# Patient Record
Sex: Female | Born: 1985 | Race: White | Hispanic: No | Marital: Single | Smoking: Former smoker
Health system: Southern US, Community
[De-identification: ages and names within clinical notes are randomized; demographics above are authoritative.]

## PROBLEM LIST (undated history)

## (undated) ENCOUNTER — Inpatient Hospital Stay: Payer: Self-pay

## (undated) DIAGNOSIS — I10 Essential (primary) hypertension: Secondary | ICD-10-CM

## (undated) HISTORY — PX: DILATION AND CURETTAGE OF UTERUS: SHX78

---
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
## 2008-11-29 ENCOUNTER — Emergency Department: Payer: Self-pay | Admitting: Emergency Medicine

## 2008-12-12 ENCOUNTER — Emergency Department: Payer: Self-pay | Admitting: Emergency Medicine

## 2009-07-06 ENCOUNTER — Emergency Department: Payer: Self-pay | Admitting: Unknown Physician Specialty

## 2009-07-26 ENCOUNTER — Ambulatory Visit: Payer: Self-pay

## 2010-07-12 ENCOUNTER — Observation Stay: Payer: Self-pay

## 2010-08-01 ENCOUNTER — Observation Stay: Payer: Self-pay | Admitting: Obstetrics and Gynecology

## 2010-08-04 ENCOUNTER — Inpatient Hospital Stay: Payer: Self-pay

## 2010-08-09 ENCOUNTER — Inpatient Hospital Stay: Payer: Self-pay | Admitting: Obstetrics and Gynecology

## 2011-01-08 ENCOUNTER — Ambulatory Visit: Payer: Self-pay

## 2011-07-23 IMAGING — US US RENAL KIDNEY
1 series · 17 of 25 positions shown · non-contrast
Comparison: none

REASON FOR EXAM: right urolitiasis, 37 weeks EGA
COMMENTS:   LMP: N/A

[Series 1: us renal kidney · 17 of 60 slices shown]
[im 1/60]
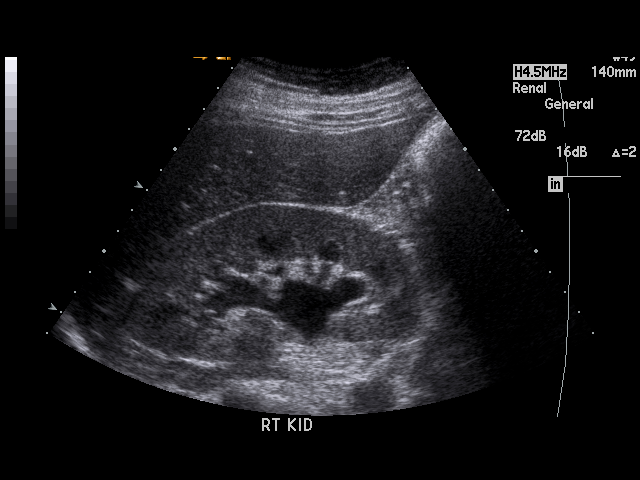
[im 5/60]
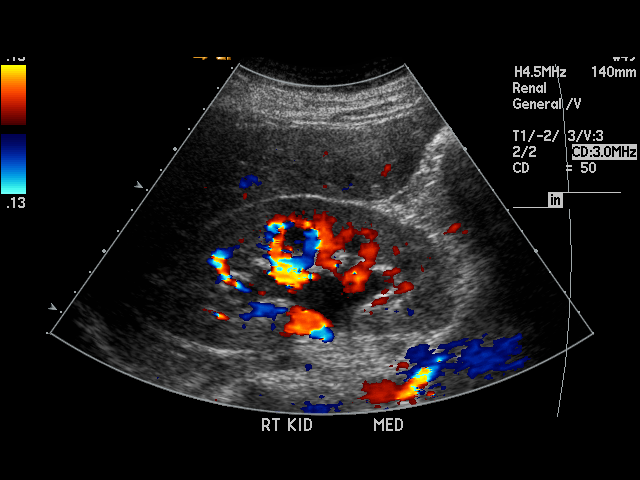
[im 8/60]
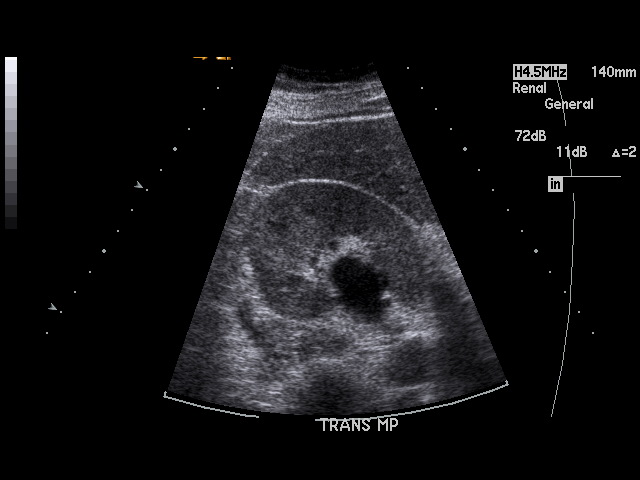
[im 13/60]
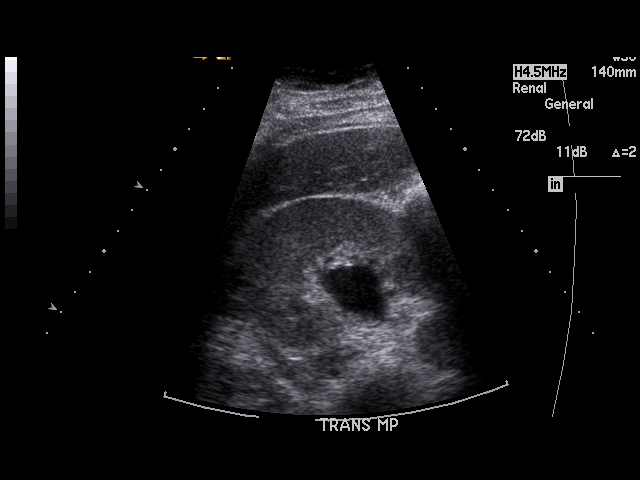
[im 15/60]
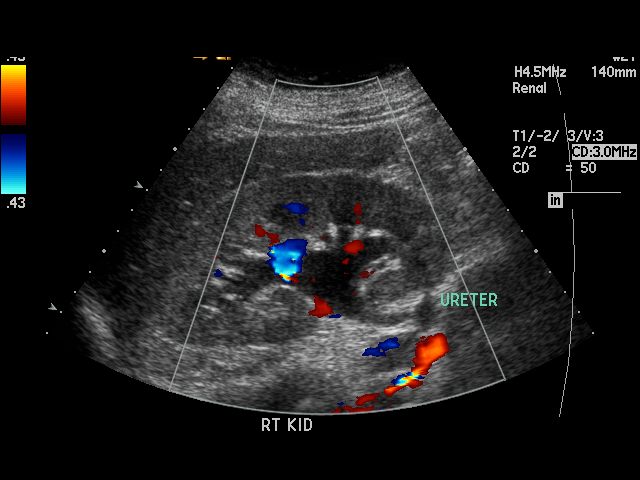
[im 20/60]
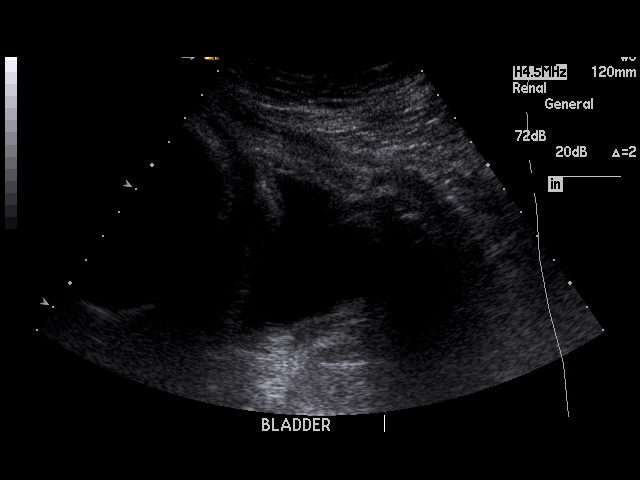
[im 23/60]
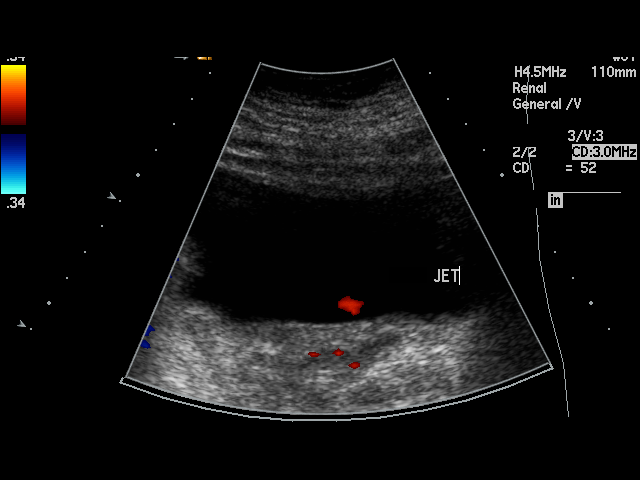
[im 28/60]
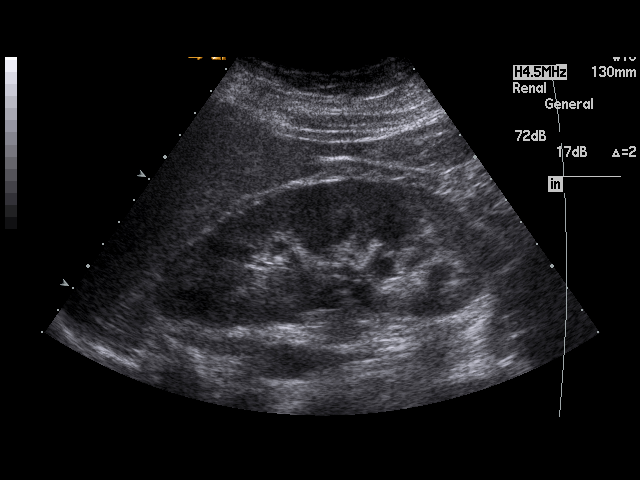
[im 30/60]
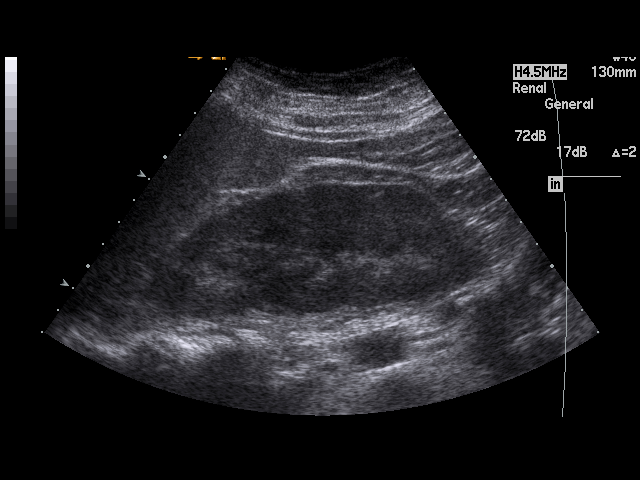
[im 32/60]
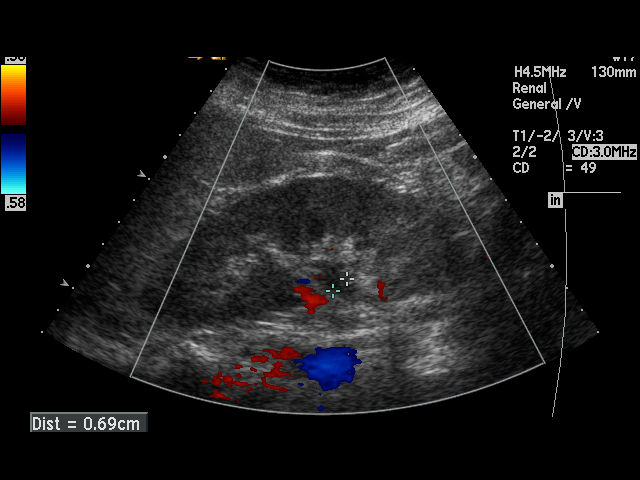
[im 37/60]
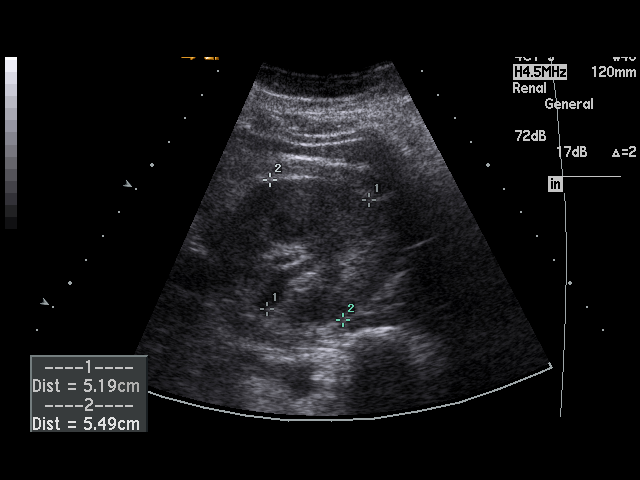
[im 40/60]
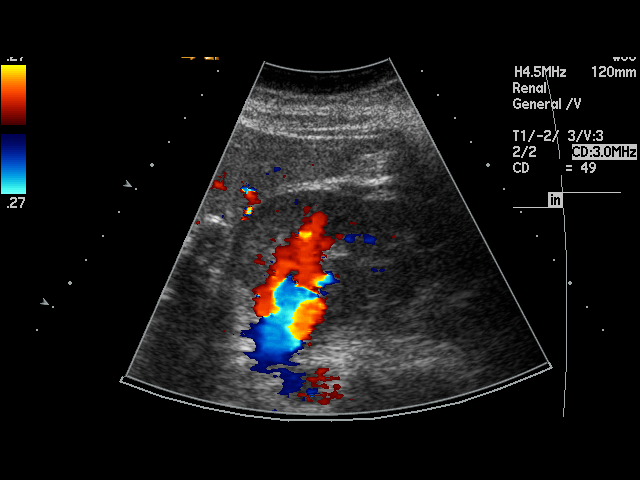
[im 45/60]
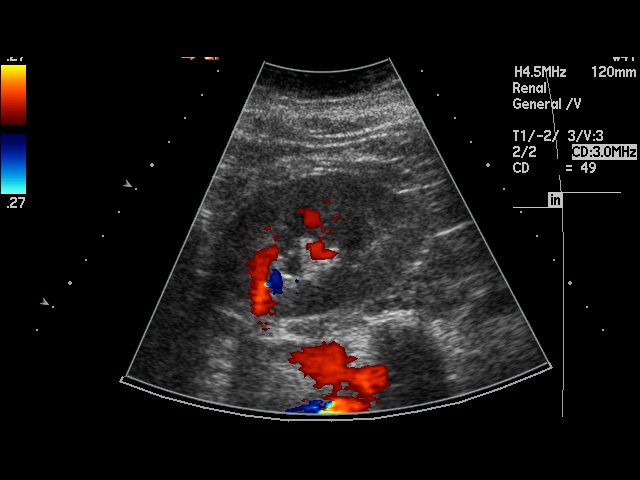
[im 47/60]
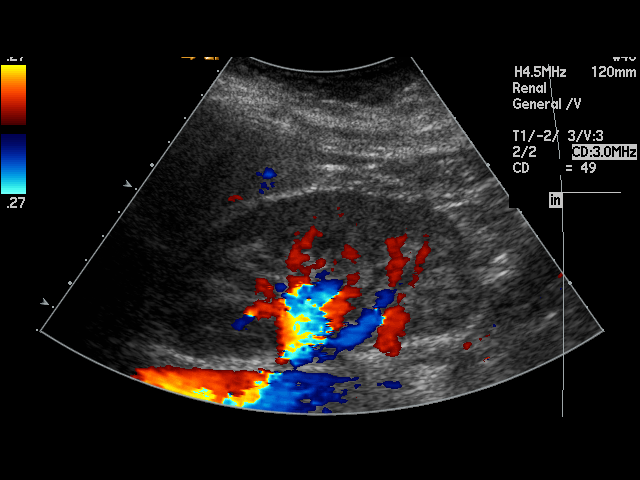
[im 52/60]
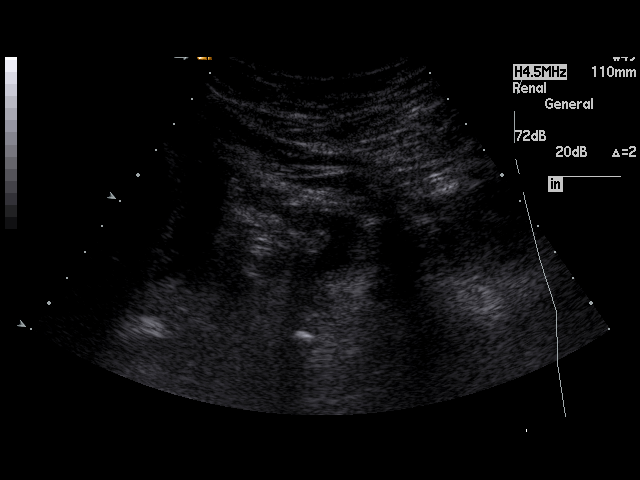
[im 55/60]
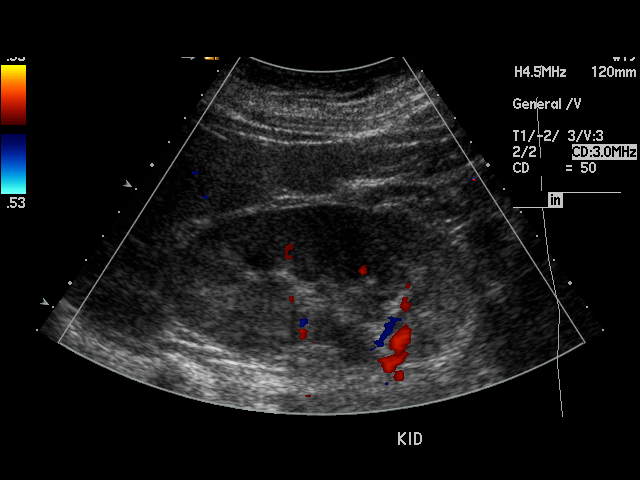
[im 60/60]
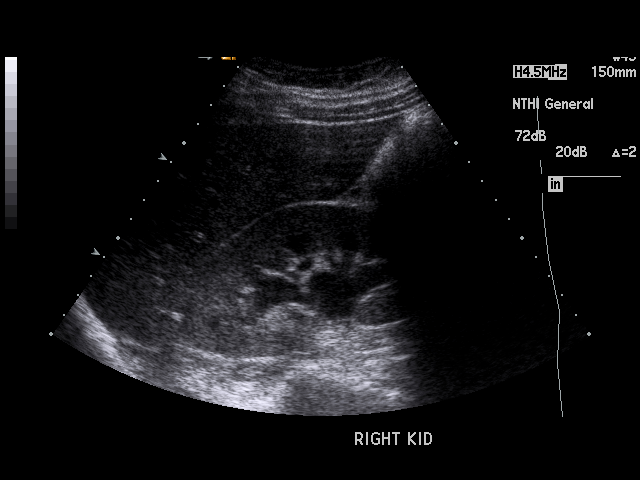

[17 of 25 positions shown; findings below may reference images not displayed]

PROCEDURE:     US  - US KIDNEY  - August 05, 2010 [DATE]

RESULT:     The right kidney measures 12.51 x 6.4 x 6.68 cm and the left
12.77 x 5.19 x 5.48 cm.

The right kidney demonstrates appropriate corticomedullary differentiation.
There is moderate hydronephrosis within the right kidney without evidence of
masses or sonographic evidence of calculus disease on the right. The
hydronephrosis involving the right kidney become slightly less prominent
post void.

The left kidney demonstrates mild caliectasis which improves post void.
Otherwise, there is no evidence of hydronephrosis. There is appropriate
corticomedullary differentiation. A calculus is identified within the lower
pole of the left kidney which appears be a nonobstructing calculus.
Evaluation of the urinary bladder demonstrates an appreciable ureteral jet
on the left with no appreciable jet on the right. There is a trace post void
residual. The bladder is otherwise unremarkable.
IMPRESSION: 1. Moderate hydronephrosis involving the right kidney, a component of which
is likely secondary to hydronephrosis of gestation.
2. Mild pelviectasis involving the left kidney again a component which is
likely secondary to the patient's pregnancy.
3. Nonobstructing calculus on the left.
4. There was no sonographic evidence of calculus disease on the right though
a distal partially obstructing calculus cannot be completely excluded.
Surveillance evaluation recommended if and as clinically warranted.

## 2011-08-23 ENCOUNTER — Emergency Department: Payer: Self-pay | Admitting: Emergency Medicine

## 2011-12-05 ENCOUNTER — Emergency Department: Payer: Self-pay | Admitting: Internal Medicine

## 2012-03-30 ENCOUNTER — Emergency Department: Payer: Self-pay | Admitting: Emergency Medicine

## 2012-03-30 LAB — COMPREHENSIVE METABOLIC PANEL
Albumin: 3.8 g/dL (ref 3.4–5.0)
Alkaline Phosphatase: 96 U/L (ref 50–136)
Anion Gap: 6 — ABNORMAL LOW (ref 7–16)
BUN: 14 mg/dL (ref 7–18)
Bilirubin,Total: 0.2 mg/dL (ref 0.2–1.0)
Creatinine: 0.82 mg/dL (ref 0.60–1.30)
EGFR (African American): >60
Glucose: 86 mg/dL (ref 65–99)
Osmolality: 272 (ref 275–301)
SGPT (ALT): 46 U/L (ref 12–78)
Total Protein: 8.5 g/dL — ABNORMAL HIGH (ref 6.4–8.2)

## 2012-03-30 LAB — CBC
MCH: 29.7 pg (ref 26.0–34.0)
MCHC: 34.5 g/dL (ref 32.0–36.0)
MCV: 86 fL (ref 80–100)
Platelet: 253 10*3/uL (ref 150–440)
RBC: 4.64 10*6/uL (ref 3.80–5.20)
RDW: 12.7 % (ref 11.5–14.5)
WBC: 9.7 10*3/uL (ref 3.6–11.0)

## 2012-03-30 LAB — URINALYSIS, COMPLETE
Bacteria: NONE SEEN
Bilirubin,UR: NEGATIVE
Ph: 6 (ref 4.5–8.0)
Protein: NEGATIVE
RBC,UR: 26 /HPF (ref 0–5)
Squamous Epithelial: <1
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
## 2014-08-20 ENCOUNTER — Emergency Department
Admission: EM | Admit: 2014-08-20 | Discharge: 2014-08-20 | Disposition: A | Discharge status: Home / Self Care | Payer: PRIVATE HEALTH INSURANCE | Attending: Emergency Medicine | Admitting: Emergency Medicine

## 2014-08-20 DIAGNOSIS — G8929 Other chronic pain: Secondary | ICD-10-CM | POA: Diagnosis not present

## 2014-08-20 DIAGNOSIS — Z880 Allergy status to penicillin: Secondary | ICD-10-CM | POA: Diagnosis not present

## 2014-08-20 DIAGNOSIS — G589 Mononeuropathy, unspecified: Secondary | ICD-10-CM | POA: Insufficient documentation

## 2014-08-20 DIAGNOSIS — M5432 Sciatica, left side: Secondary | ICD-10-CM | POA: Insufficient documentation

## 2014-08-20 DIAGNOSIS — G5702 Lesion of sciatic nerve, left lower limb: Secondary | ICD-10-CM

## 2014-08-20 DIAGNOSIS — M549 Dorsalgia, unspecified: Secondary | ICD-10-CM | POA: Diagnosis present

## 2014-08-20 MED ORDER — HYDROMORPHONE HCL 1 MG/ML IJ SOLN
INTRAMUSCULAR | Status: AC
  Administered 2014-08-20: 1 mg via INTRAMUSCULAR
  Filled 2014-08-20: qty 1

## 2014-08-20 MED ORDER — HYDROMORPHONE HCL 1 MG/ML IJ SOLN
1.0000 mg | Freq: Once | INTRAMUSCULAR | Status: AC
  Administered 2014-08-20: 1 mg via INTRAMUSCULAR

## 2014-08-20 MED ORDER — DEXAMETHASONE SODIUM PHOSPHATE 10 MG/ML IJ SOLN
INTRAMUSCULAR | Status: AC
  Administered 2014-08-20: 10 mg via INTRAMUSCULAR
  Filled 2014-08-20: qty 1

## 2014-08-20 MED ORDER — DEXAMETHASONE SODIUM PHOSPHATE 10 MG/ML IJ SOLN
10.0000 mg | Freq: Once | INTRAMUSCULAR | Status: AC
  Administered 2014-08-20: 10 mg via INTRAMUSCULAR

## 2014-08-20 MED ORDER — METHYLPREDNISOLONE 4 MG PO TBPK: ORAL_TABLET | ORAL | Status: DC

## 2014-08-20 NOTE — ED Provider Notes (Signed)
[facility] Emergency Department Provider Note  ____________________________________________  Time seen: Approximately 4:38 PM  I have reviewed the triage vital signs and the nursing notes.   HISTORY  Chief Complaint Back Pain    HPI Rhonda Perez is a 29 y.o. female patient to the ER today with complaint of left sciatica. Patient has a 1 year plus of chronic back pain and radicular component to left lower extremity. Recently patient was seen by her Dr. and at the ER in [place]. Patient state the pain medication and anti-inflammatory is not helping. Numbness is increasing in the past 3 days. He denies any bladder or bowel dysfunction. Patient is rating her pain as a 10 over 10. Patient states she is unable to ambulate for any distance secondary to numbness of the leg.   No past medical history on file.  There are no active problems to display for this patient.   No past surgical history on file.  No current outpatient prescriptions on file.  Allergies Penicillins  No family history on file.  Social History History  Substance Use Topics  . Smoking status: Not on file  . Smokeless tobacco: Not on file  . Alcohol Use: Not on file    Review of Systems Constitutional: No fever/chills Eyes: No visual changes. ENT: No sore throat. Cardiovascular: Denies chest pain. Respiratory: Denies shortness of breath. Gastrointestinal: No abdominal pain.  No nausea, no vomiting.  No diarrhea.  No constipation. Genitourinary: Negative for dysuria. Musculoskeletal: Radicular left back pain. Skin: Negative for rash. Neurological: Negative for headaches, focal  Numbness to the left lower extremity.. : Allergic/Immunilogical: See medication list. 10-point ROS otherwise negative.  ____________________________________________   PHYSICAL EXAM:  VITAL SIGNS: ED Triage Vitals  Enc Vitals Group     BP 08/20/14 1524 129/87 mmHg     Pulse Rate 08/20/14  1524 95     Resp 08/20/14 1524 18     Temp 08/20/14 1524 98.8 F (37.1 C)     Temp Source 08/20/14 1524 Oral     SpO2 08/20/14 1524 99 %     Weight 08/20/14 1524 214 lb (97.07 kg)     Height 08/20/14 1524 4\' 11"  (1.499 m)     Head Cir --      Peak Flow --      Pain Score 08/20/14 1521 10     Pain Loc --      Pain Edu? --      Excl. in GC? --     Constitutional: Alert and oriented. Well appearing and in no acute distress. Overweight height with truncal obesity. Eyes: Conjunctivae are normal. PERRL. EOMI. Head: Atraumatic. Nose: No congestion/rhinnorhea. Mouth/Throat: Mucous membranes are moist.  Oropharynx non-erythematous. Neck: No stridor. No deformity for nuchal range of motion nontender palpation. Hematological/Lymphatic/Immunilogical: No cervical lymphadenopathy. Cardiovascular: Normal rate, regular rhythm. Grossly normal heart sounds.  Good peripheral circulation. Respiratory: Normal respiratory effort.  No retractions. Lungs CTAB. Gastrointestinal: Soft and nontender. No distention. No abdominal bruits. No CVA tenderness. Musculoskeletal: No spinal deformity. Moderate guarding palpation L3-S1. Neurologic:  Normal speech and language. No gross focal neurologic deficits are appreciated. Speech is normal. No gait instability. Skin:  Skin is warm, dry and intact. No rash noted. Psychiatric: Mood and affect are normal. Speech and behavior are normal.  ____________________________________________   LABS (all labs ordered are listed, but only abnormal results are displayed)  Labs Reviewed - No data to display ____________________________________________  EKG   ____________________________________________  RADIOLOGY  ____________________________________________   PROCEDURES  Procedure(s) performed: None  Critical Care performed: No  ____________________________________________   INITIAL IMPRESSION / ASSESSMENT AND PLAN / ED COURSE  Pertinent labs & imaging  results that were available during my care of the patient were reviewed by me and considered in my medical decision making (see chart for details).  Radicular back pain. ____________________________________________   FINAL CLINICAL IMPRESSION(S) / ED DIAGNOSES  Final diagnoses:  Sciatic mononeuropathy, left      Joni Reining, PA-C 08/20/14 1703  Sharyn Creamer, MD 08/21/14 [phone]

## 2014-08-20 NOTE — Discharge Instructions (Signed)
Advise to see PCP for MRI consideration/consult.

## 2014-08-20 NOTE — ED Notes (Signed)
Pt states hx of sciatica, states that since last Saturday she has been having pain in her back and left leg, reports that she has been seen by her dr and in the er at [facility]. Pt states that despite the medications she isn't improving and states that her left has been feeling numb for the past 3 days

## 2016-03-28 ENCOUNTER — Inpatient Hospital Stay
Admission: EM | Admit: 2016-03-28 | Discharge: 2016-03-28 | Disposition: A | Discharge status: Home / Self Care | Payer: Medicaid Other | Attending: Obstetrics and Gynecology | Admitting: Obstetrics and Gynecology

## 2016-03-28 ENCOUNTER — Encounter: Payer: Self-pay | Admitting: *Deleted

## 2016-03-28 DIAGNOSIS — O471 False labor at or after 37 completed weeks of gestation: Secondary | ICD-10-CM | POA: Diagnosis not present

## 2016-03-28 DIAGNOSIS — Z3A41 41 weeks gestation of pregnancy: Secondary | ICD-10-CM | POA: Diagnosis not present

## 2016-03-28 NOTE — Final Progress Note (Signed)
Physician Final Progress Note  Patient ID: Rhonda Perez MRN: [id number] DOB/AGE: 1985/06/05 30 y.o.  Admit date: 03/28/2016 Admitting provider: Conard NovakStephen D Jackson, MD Discharge date: 03/28/2016   Admission Diagnoses:  1) intrauterine pregnancy at 9070w0d  2) regular uterine contractions  Discharge Diagnoses:  1) intrauterine pregnancy at 3470w0d  2) false labor  History of Present Illness: The patient is a [age] y.o. female [phone] at 370w0d who presents for contractions and back pain that have been intermittent throughout the day. She notes +FM, no LOF, and no vaginal bleeding.  [facility] and [facility]   Hospital Course: Admitted for observation.  Fetal tracing reactive, category 1, the entire time she was here.  Cervical exam was closed.  Her vital signs were normal.  She was discharged with follow up in 4 days and encouraged to secure a delivery plan, given her gestational age.  Allergies  Allergen Reactions  . Penicillins Hives    Social History   Social History  . Marital status: Single    Spouse name: N/A  . Number of children: N/A  . Years of education: N/A   Occupational History  . Not on file.   Social History Main Topics  . Smoking status: Former Games developermoker  . Smokeless tobacco: Former NeurosurgeonUser  . Alcohol use No  . Drug use: No  . Sexual activity: Yes   Other Topics Concern  . Not on file   Social History Narrative  . No narrative on file    Physical Exam: BP (!) 121/55 (BP Location: Right Arm)   Pulse 100   Temp 98.3 F (36.8 C) (Oral)   Resp 18   Gen: NAD CV: RRR Pulm: CTAB Pelvic: closed per RN Ext: no e/c/t  Consults: None  Significant Findings/ Diagnostic Studies: None  Procedures: NST Baseline: 150 Variability: moderate Accelerations: present Decelerations: absent Tocometry: infrequent   Discharge Condition: stable  Disposition: 01-Home or Self Care  Diet: Regular diet  Discharge  Activity: Activity as tolerated   Allergies as of 03/28/2016      Reactions   Penicillins Hives      Medication List    STOP taking these medications   methylPREDNISolone 4 MG Tbpk tablet Commonly known as:  MEDROL DOSEPAK        Total time spent taking care of this patient: 30 minutes  Signed: Conard NovakJackson, Stephen D, MD  03/28/2016, 6:33 PM

## 2016-03-28 NOTE — Discharge Summary (Signed)
See final progress note. 

## 2016-03-28 NOTE — Discharge Instructions (Signed)
Discharge instructions and labor precautions discussed with patient.  Patient verbalized understanding of all discharge instructions, copy signed and given.  Pt. Also encouraged to follow [facility]. Pt. verbalized scheduled [facility]. Pt. Encouraged to keep appt. for delivery.

## 2016-03-28 NOTE — OB Triage Note (Signed)
Pt. Here due to pain on right side that radiates to lower back, pain started this morning between 6-7 a.m. Pt. Denies sudden gush of fluid, or vaginal bleeding.  He's not moving as much as he normally does. Last sexual encounter was last night.

## 2017-01-12 ENCOUNTER — Encounter (HOSPITAL_COMMUNITY): Payer: Self-pay

## 2023-08-22 ENCOUNTER — Encounter: Payer: Self-pay | Admitting: Obstetrics and Gynecology

## 2023-08-22 ENCOUNTER — Other Ambulatory Visit: Payer: Self-pay

## 2023-08-22 ENCOUNTER — Observation Stay
Admission: EM | Admit: 2023-08-22 | Discharge: 2023-08-22 | Disposition: A | Discharge status: Home / Self Care | Attending: Obstetrics and Gynecology | Admitting: Obstetrics and Gynecology

## 2023-08-22 DIAGNOSIS — O99213 Obesity complicating pregnancy, third trimester: Secondary | ICD-10-CM | POA: Diagnosis not present

## 2023-08-22 DIAGNOSIS — Z7982 Long term (current) use of aspirin: Secondary | ICD-10-CM | POA: Insufficient documentation

## 2023-08-22 DIAGNOSIS — Z87891 Personal history of nicotine dependence: Secondary | ICD-10-CM | POA: Diagnosis not present

## 2023-08-22 DIAGNOSIS — O10912 Unspecified pre-existing hypertension complicating pregnancy, second trimester: Secondary | ICD-10-CM | POA: Insufficient documentation

## 2023-08-22 DIAGNOSIS — O368120 Decreased fetal movements, second trimester, not applicable or unspecified: Principal | ICD-10-CM | POA: Diagnosis not present

## 2023-08-22 DIAGNOSIS — Z79899 Other long term (current) drug therapy: Secondary | ICD-10-CM | POA: Diagnosis not present

## 2023-08-22 DIAGNOSIS — Z3A27 27 weeks gestation of pregnancy: Secondary | ICD-10-CM | POA: Insufficient documentation

## 2023-08-22 DIAGNOSIS — Z6841 Body Mass Index (BMI) 40.0 and over, adult: Secondary | ICD-10-CM | POA: Diagnosis not present

## 2023-08-22 DIAGNOSIS — E669 Obesity, unspecified: Secondary | ICD-10-CM | POA: Diagnosis not present

## 2023-08-22 HISTORY — DX: Essential (primary) hypertension: I10

## 2023-08-22 NOTE — Discharge Summary (Signed)
 Rhonda Perez is a 38 y.o. female. She is at [redacted]w[redacted]d gestation. No LMP recorded. Patient is pregnant. Estimated Date of Delivery: 11/19/23  Prenatal care site: [facility]   Chief complaint: Decreased Fetal Movement   HPI: Rhonda Perez presents to L&D with concerns of decreased fetal movement. She reports she has felt little movement since last night (06/13). Denies contractions, vaginal bleeding, and leakage of fluid. She reports active fetal movement today (06/14) upon US  and toco monitored being placed by RN.   Factors complicating pregnancy: Obesity- BMI: 43.83  S: Resting comfortably. Denies contractions, vaginal bleeding and leakage of fluid.   Maternal Medical History:  Past Medical Hx:  has a past medical history of Hypertension.    Past Surgical Hx:  has a past surgical history that includes Dilation and curettage of uterus.   Allergies  Allergen Reactions   Penicillins Hives     Prior to Admission medications   Medication Sig Start Date End Date Taking? Authorizing Provider  aspirin EC 81 MG tablet Take 81 mg by mouth daily. Swallow whole.   Yes [provider]  NIFEdipine (PROCARDIA-XL/NIFEDICAL-XL) 30 MG 24 hr tablet Take 30 mg by mouth daily.   Yes [provider]    Social History: She  reports that she has quit smoking. She has quit using smokeless tobacco. She reports that she does not drink alcohol and does not use drugs.  Family History: family history is not on file.   Review of Systems:  Review of Systems  Constitutional: Negative.   Gastrointestinal: Negative.   Genitourinary: Negative.   Psychiatric/Behavioral: Negative.      O:  BP (!) 111/53 (BP Location: Left Arm)   Pulse (!) 110   Temp 98.2 F (36.8 C) (Oral)   Resp 18   Ht 4' 11 (1.499 m)   Wt 98.4 kg   BMI 43.83 kg/m  No results found for this or any previous visit (from the past 48 hours).   Constitutional: NAD, AAOx3  HE/ENT: extraocular  movements grossly intact CV: RRR PULM: nl respiratory effort Abd: gravid, non-tender, non-distended, soft  Ext: Non-tender, Nonedmeatous Psych: mood appropriate, speech normal Pelvic : Deferred  SVE: Deferred   NST (08/22/2023):  Baseline: 140 bpm Variability: moderate Accels: Present Decels: none Toco: none Category: I Interpretation:  INDICATIONS: decreased fetal movement RESULTS:  A NST procedure was performed with FHR monitoring and a normal baseline established, appropriate time of 20-40 minutes of evaluation, and accels >2 seen w 10 x10 characteristics.  Results show a REACTIVE NST.   Assessment: 38 y.o. [redacted]w[redacted]d here for antenatal surveillance during pregnancy.  Principle diagnosis: Decreased fetal movement affecting management of pregnancy in second trimester   Plan: R/O Decreased Fetal Movement  Fetal Wellbeing: Reassuring Cat 1 tracing. Reactive NST  Patient reported she felt fetal movement multiple times during NST.  Education on starting fetal kick counts @ 28 weeks discussed with patient and information provided in AVS. Patient verbalized understanding.   - D/c home stable, strict return precautions reviewed, follow-up as needed.   ----- Rhonda Perez, CNM Certified Nurse Midwife [facility]

## 2023-08-22 NOTE — OB Triage Note (Signed)
 Patient is a [id number] at [redacted]w[redacted]d (per patient) that reports to unit c/o decreased fetal movement since last night. Denies contractions, vaginal bleeding, and leakage of fluid. Patient receives prenatal care at [facility] : [facility]. External monitors applied and assessing. Initial FHT 160. Vital signs WDL.

## 2023-08-22 NOTE — Discharge Instructions (Signed)
 Start doing fetal kick counts at 28 weeks! :)

## 2023-08-22 NOTE — OB Triage Note (Signed)
Discharge instructions, labor precautions, and follow-up care reviewed with patient. All questions answered. Patient verbalized understanding. Discharged ambulatory off unit.
